# Patient Record
Sex: Female | Born: 1935 | Race: White | Hispanic: No
Health system: Southern US, Community
[De-identification: ages and names within clinical notes are randomized; demographics above are authoritative.]

---

## 2006-04-12 ENCOUNTER — Other Ambulatory Visit: Payer: Self-pay

## 2006-04-12 ENCOUNTER — Emergency Department: Payer: Self-pay | Admitting: Emergency Medicine

## 2012-11-09 ENCOUNTER — Emergency Department: Payer: Self-pay | Admitting: Emergency Medicine

## 2012-12-02 ENCOUNTER — Inpatient Hospital Stay: Payer: Self-pay | Admitting: Orthopedic Surgery

## 2012-12-02 LAB — URINALYSIS, COMPLETE
Blood: NEGATIVE
Ketone: NEGATIVE
Nitrite: NEGATIVE
Protein: NEGATIVE
RBC,UR: 7 /HPF (ref 0–5)
Specific Gravity: 1.021 (ref 1.003–1.030)
Squamous Epithelial: <1

## 2012-12-02 LAB — COMPREHENSIVE METABOLIC PANEL
Alkaline Phosphatase: 84 U/L (ref 50–136)
BUN: 29 mg/dL — ABNORMAL HIGH (ref 7–18)
Bilirubin,Total: 0.3 mg/dL (ref 0.2–1.0)
EGFR (African American): >60
Osmolality: 285 (ref 275–301)
Potassium: 4.1 mmol/L (ref 3.5–5.1)
Sodium: 140 mmol/L (ref 136–145)

## 2012-12-02 LAB — CBC
HCT: 35.1 % (ref 35.0–47.0)
HGB: 11.4 g/dL — ABNORMAL LOW (ref 12.0–16.0)
WBC: 7.9 10*3/uL (ref 3.6–11.0)

## 2012-12-03 LAB — CBC WITH DIFFERENTIAL/PLATELET
Eosinophil %: 3.6 %
MCH: 30.9 pg (ref 26.0–34.0)
MCHC: 33.7 g/dL (ref 32.0–36.0)
Neutrophil #: 4.8 10*3/uL (ref 1.4–6.5)
Neutrophil %: 71.3 %
RDW: 14.9 % — ABNORMAL HIGH (ref 11.5–14.5)
WBC: 6.7 10*3/uL (ref 3.6–11.0)

## 2012-12-03 LAB — BASIC METABOLIC PANEL
BUN: 24 mg/dL — ABNORMAL HIGH (ref 7–18)
Calcium, Total: 8.1 mg/dL — ABNORMAL LOW (ref 8.5–10.1)
Chloride: 107 mmol/L (ref 98–107)
Co2: 31 mmol/L (ref 21–32)
Creatinine: 0.9 mg/dL (ref 0.60–1.30)
EGFR (African American): >60
EGFR (Non-African Amer.): 53 — ABNORMAL LOW
Glucose: 100 mg/dL — ABNORMAL HIGH (ref 65–99)
Potassium: 3.8 mmol/L (ref 3.5–5.1)
Sodium: 141 mmol/L (ref 136–145)

## 2012-12-04 LAB — CBC WITH DIFFERENTIAL/PLATELET
Basophil #: 0.1 10*3/uL (ref 0.0–0.1)
Eosinophil #: 0 10*3/uL (ref 0.0–0.7)
Eosinophil %: 0 %
HCT: 24.8 % — ABNORMAL LOW (ref 35.0–47.0)
HGB: 8.3 g/dL — ABNORMAL LOW (ref 12.0–16.0)
MCHC: 33.6 g/dL (ref 32.0–36.0)
MCV: 92 fL (ref 80–100)
Monocyte #: 0.6 x10 3/mm (ref 0.2–0.9)
Neutrophil #: 8.8 10*3/uL — ABNORMAL HIGH (ref 1.4–6.5)
Platelet: 275 10*3/uL (ref 150–440)
WBC: 10.2 10*3/uL (ref 3.6–11.0)

## 2012-12-04 LAB — BASIC METABOLIC PANEL
Anion Gap: 5 — ABNORMAL LOW (ref 7–16)
BUN: 25 mg/dL — ABNORMAL HIGH (ref 7–18)
Calcium, Total: 7.9 mg/dL — ABNORMAL LOW (ref 8.5–10.1)
Chloride: 103 mmol/L (ref 98–107)
Co2: 31 mmol/L (ref 21–32)
EGFR (African American): 48 — ABNORMAL LOW
EGFR (Non-African Amer.): 42 — ABNORMAL LOW
Glucose: 136 mg/dL — ABNORMAL HIGH (ref 65–99)
Potassium: 4.1 mmol/L (ref 3.5–5.1)
Sodium: 139 mmol/L (ref 136–145)

## 2012-12-05 LAB — CBC WITH DIFFERENTIAL/PLATELET
Basophil #: 0 10*3/uL (ref 0.0–0.1)
Basophil %: 0.3 %
Eosinophil #: 0 10*3/uL (ref 0.0–0.7)
HCT: 19.9 % — ABNORMAL LOW (ref 35.0–47.0)
MCHC: 34.5 g/dL (ref 32.0–36.0)
Monocyte #: 0.7 x10 3/mm (ref 0.2–0.9)
Monocyte %: 8.1 %
Neutrophil #: 7.7 10*3/uL — ABNORMAL HIGH (ref 1.4–6.5)
Neutrophil %: 83.3 %
RBC: 2.19 10*6/uL — ABNORMAL LOW (ref 3.80–5.20)
RDW: 14.6 % — ABNORMAL HIGH (ref 11.5–14.5)
WBC: 9.2 10*3/uL (ref 3.6–11.0)

## 2012-12-05 LAB — BASIC METABOLIC PANEL
Chloride: 106 mmol/L (ref 98–107)
EGFR (African American): 41 — ABNORMAL LOW
EGFR (Non-African Amer.): 35 — ABNORMAL LOW
Glucose: 142 mg/dL — ABNORMAL HIGH (ref 65–99)
Potassium: 3.6 mmol/L (ref 3.5–5.1)
Sodium: 139 mmol/L (ref 136–145)

## 2012-12-06 LAB — PATHOLOGY REPORT

## 2012-12-16 LAB — COMPREHENSIVE METABOLIC PANEL
Alkaline Phosphatase: 126 U/L (ref 50–136)
Anion Gap: 7 (ref 7–16)
BUN: 29 mg/dL — ABNORMAL HIGH (ref 7–18)
Bilirubin,Total: 0.7 mg/dL (ref 0.2–1.0)
Calcium, Total: 8.7 mg/dL (ref 8.5–10.1)
Chloride: 100 mmol/L (ref 98–107)
Co2: 26 mmol/L (ref 21–32)
Creatinine: 1.24 mg/dL (ref 0.60–1.30)
EGFR (Non-African Amer.): 36 — ABNORMAL LOW
Glucose: 133 mg/dL — ABNORMAL HIGH (ref 65–99)
Osmolality: 274 (ref 275–301)
Potassium: 3.8 mmol/L (ref 3.5–5.1)
SGOT(AST): 34 U/L (ref 15–37)
SGPT (ALT): 38 U/L (ref 12–78)

## 2012-12-16 LAB — CBC
HCT: 30.1 % — ABNORMAL LOW (ref 35.0–47.0)
HGB: 9.2 g/dL — ABNORMAL LOW (ref 12.0–16.0)
MCH: 28.1 pg (ref 26.0–34.0)
MCV: 92 fL (ref 80–100)
RBC: 3.26 10*6/uL — ABNORMAL LOW (ref 3.80–5.20)
RDW: 16.2 % — ABNORMAL HIGH (ref 11.5–14.5)

## 2012-12-16 LAB — APTT: Activated PTT: 32.9 secs (ref 23.6–35.9)

## 2012-12-17 LAB — CBC WITH DIFFERENTIAL/PLATELET
Basophil #: 0 10*3/uL (ref 0.0–0.1)
Eosinophil #: 0.4 10*3/uL (ref 0.0–0.7)
Eosinophil %: 4 %
HCT: 24.9 % — ABNORMAL LOW (ref 35.0–47.0)
Lymphocyte #: 0.4 10*3/uL — ABNORMAL LOW (ref 1.0–3.6)
MCH: 31.5 pg (ref 26.0–34.0)
Monocyte #: 0.6 x10 3/mm (ref 0.2–0.9)
Monocyte %: 5.6 %
RBC: 2.7 10*6/uL — ABNORMAL LOW (ref 3.80–5.20)
RDW: 15.6 % — ABNORMAL HIGH (ref 11.5–14.5)

## 2012-12-17 LAB — BASIC METABOLIC PANEL
Anion Gap: 5 — ABNORMAL LOW (ref 7–16)
BUN: 29 mg/dL — ABNORMAL HIGH (ref 7–18)
Calcium, Total: 8.5 mg/dL (ref 8.5–10.1)
Chloride: 99 mmol/L (ref 98–107)
Co2: 30 mmol/L (ref 21–32)
Creatinine: 1.11 mg/dL (ref 0.60–1.30)
EGFR (African American): 48 — ABNORMAL LOW
Glucose: 108 mg/dL — ABNORMAL HIGH (ref 65–99)
Potassium: 3.5 mmol/L (ref 3.5–5.1)
Sodium: 134 mmol/L — ABNORMAL LOW (ref 136–145)

## 2012-12-18 ENCOUNTER — Inpatient Hospital Stay: Payer: Self-pay | Admitting: Internal Medicine

## 2012-12-18 LAB — CBC WITH DIFFERENTIAL/PLATELET
Basophil %: 0.4 %
Eosinophil #: 0.3 10*3/uL (ref 0.0–0.7)
Eosinophil %: 3.7 %
HCT: 23.3 % — ABNORMAL LOW (ref 35.0–47.0)
HGB: 7.9 g/dL — ABNORMAL LOW (ref 12.0–16.0)
Lymphocyte #: 0.5 10*3/uL — ABNORMAL LOW (ref 1.0–3.6)
MCHC: 34 g/dL (ref 32.0–36.0)
MCV: 92 fL (ref 80–100)
Monocyte #: 0.5 x10 3/mm (ref 0.2–0.9)
Neutrophil %: 82.5 %
Platelet: 222 10*3/uL (ref 150–440)
RBC: 2.54 10*6/uL — ABNORMAL LOW (ref 3.80–5.20)
RDW: 15.6 % — ABNORMAL HIGH (ref 11.5–14.5)
WBC: 7.3 10*3/uL (ref 3.6–11.0)

## 2012-12-19 LAB — CBC WITH DIFFERENTIAL/PLATELET
Basophil %: 0.7 %
Eosinophil %: 7 %
HCT: 28.9 % — ABNORMAL LOW (ref 35.0–47.0)
HGB: 9.9 g/dL — ABNORMAL LOW (ref 12.0–16.0)
Lymphocyte #: 0.7 10*3/uL — ABNORMAL LOW (ref 1.0–3.6)
Lymphocyte %: 12.5 %
MCH: 30.8 pg (ref 26.0–34.0)
MCV: 90 fL (ref 80–100)
Monocyte %: 10 %
Neutrophil %: 69.8 %
RBC: 3.22 10*6/uL — ABNORMAL LOW (ref 3.80–5.20)
RDW: 15.3 % — ABNORMAL HIGH (ref 11.5–14.5)
WBC: 5.8 10*3/uL (ref 3.6–11.0)

## 2012-12-20 ENCOUNTER — Ambulatory Visit: Payer: Self-pay | Admitting: Internal Medicine

## 2012-12-20 LAB — CBC WITH DIFFERENTIAL/PLATELET
Basophil #: 0.1 10*3/uL (ref 0.0–0.1)
Basophil %: 0.9 %
HCT: 28.7 % — ABNORMAL LOW (ref 35.0–47.0)
Lymphocyte #: 0.9 10*3/uL — ABNORMAL LOW (ref 1.0–3.6)
MCHC: 34.6 g/dL (ref 32.0–36.0)
MCV: 90 fL (ref 80–100)
Monocyte %: 9.4 %
Neutrophil %: 68.2 %
Platelet: 253 10*3/uL (ref 150–440)
RBC: 3.19 10*6/uL — ABNORMAL LOW (ref 3.80–5.20)

## 2012-12-20 LAB — BASIC METABOLIC PANEL
Creatinine: 0.86 mg/dL (ref 0.60–1.30)
EGFR (African American): >60
EGFR (Non-African Amer.): 56 — ABNORMAL LOW
Glucose: 97 mg/dL (ref 65–99)
Osmolality: 276 (ref 275–301)
Sodium: 136 mmol/L (ref 136–145)

## 2012-12-20 LAB — PROTIME-INR
INR: 1.1
Prothrombin Time: 14.2 secs (ref 11.5–14.7)

## 2012-12-22 LAB — PROTIME-INR: Prothrombin Time: 15.3 secs — ABNORMAL HIGH (ref 11.5–14.7)

## 2012-12-22 LAB — CBC WITH DIFFERENTIAL/PLATELET
Basophil #: 0.1 10*3/uL (ref 0.0–0.1)
Basophil %: 0.8 %
Eosinophil #: 0.3 10*3/uL (ref 0.0–0.7)
Eosinophil %: 4 %
HGB: 9.5 g/dL — ABNORMAL LOW (ref 12.0–16.0)
Lymphocyte #: 1.2 10*3/uL (ref 1.0–3.6)
MCH: 30.8 pg (ref 26.0–34.0)
MCHC: 34.8 g/dL (ref 32.0–36.0)
MCV: 89 fL (ref 80–100)
Monocyte #: 0.7 x10 3/mm (ref 0.2–0.9)
Monocyte %: 8.9 %
Neutrophil #: 5.8 10*3/uL (ref 1.4–6.5)
RDW: 15.6 % — ABNORMAL HIGH (ref 11.5–14.5)
WBC: 8.1 10*3/uL (ref 3.6–11.0)

## 2012-12-22 LAB — BASIC METABOLIC PANEL
BUN: 21 mg/dL — ABNORMAL HIGH (ref 7–18)
Calcium, Total: 8.1 mg/dL — ABNORMAL LOW (ref 8.5–10.1)
Chloride: 104 mmol/L (ref 98–107)
Co2: 30 mmol/L (ref 21–32)
Creatinine: 0.65 mg/dL (ref 0.60–1.30)
EGFR (Non-African Amer.): >60
Glucose: 93 mg/dL (ref 65–99)
Osmolality: 280 (ref 275–301)
Potassium: 4.2 mmol/L (ref 3.5–5.1)

## 2012-12-23 LAB — CBC WITH DIFFERENTIAL/PLATELET
Basophil %: 1.1 %
Eosinophil %: 5.5 %
HCT: 26.3 % — ABNORMAL LOW (ref 35.0–47.0)
Lymphocyte #: 1.1 10*3/uL (ref 1.0–3.6)
Lymphocyte %: 16.5 %
MCH: 30.7 pg (ref 26.0–34.0)
MCHC: 33.9 g/dL (ref 32.0–36.0)
MCV: 91 fL (ref 80–100)
Monocyte #: 0.5 x10 3/mm (ref 0.2–0.9)
Monocyte %: 8.2 %
Neutrophil #: 4.5 10*3/uL (ref 1.4–6.5)
Neutrophil %: 68.7 %
RBC: 2.89 10*6/uL — ABNORMAL LOW (ref 3.80–5.20)
WBC: 6.5 10*3/uL (ref 3.6–11.0)

## 2012-12-23 LAB — BASIC METABOLIC PANEL
Anion Gap: 2 — ABNORMAL LOW (ref 7–16)
EGFR (African American): >60
Osmolality: 275 (ref 275–301)
Potassium: 3.9 mmol/L (ref 3.5–5.1)

## 2012-12-24 LAB — URINALYSIS, COMPLETE
Bilirubin,UR: NEGATIVE
Glucose,UR: NEGATIVE mg/dL (ref 0–75)
Hyaline Cast: 5
Leukocyte Esterase: NEGATIVE
Nitrite: NEGATIVE
Ph: 7 (ref 4.5–8.0)
RBC,UR: 6 /HPF (ref 0–5)
Specific Gravity: 1.021 (ref 1.003–1.030)
Squamous Epithelial: 2
WBC UR: 6 /HPF (ref 0–5)

## 2012-12-24 LAB — WOUND AEROBIC CULTURE

## 2012-12-25 LAB — BASIC METABOLIC PANEL
Anion Gap: 5 — ABNORMAL LOW (ref 7–16)
BUN: 20 mg/dL — ABNORMAL HIGH (ref 7–18)
Calcium, Total: 8.5 mg/dL (ref 8.5–10.1)
Chloride: 96 mmol/L — ABNORMAL LOW (ref 98–107)
Co2: 36 mmol/L — ABNORMAL HIGH (ref 21–32)
Creatinine: 0.97 mg/dL (ref 0.60–1.30)
EGFR (African American): 56 — ABNORMAL LOW
Glucose: 105 mg/dL — ABNORMAL HIGH (ref 65–99)
Potassium: 3.5 mmol/L (ref 3.5–5.1)
Sodium: 137 mmol/L (ref 136–145)

## 2012-12-26 LAB — BASIC METABOLIC PANEL
Anion Gap: 5 — ABNORMAL LOW (ref 7–16)
BUN: 18 mg/dL (ref 7–18)
Calcium, Total: 7.9 mg/dL — ABNORMAL LOW (ref 8.5–10.1)
Co2: 34 mmol/L — ABNORMAL HIGH (ref 21–32)
Glucose: 83 mg/dL (ref 65–99)
Osmolality: 277 (ref 275–301)
Sodium: 138 mmol/L (ref 136–145)

## 2012-12-26 LAB — WOUND CULTURE

## 2012-12-26 LAB — HEPATIC FUNCTION PANEL A (ARMC)
Alkaline Phosphatase: 91 U/L (ref 50–136)
Bilirubin, Direct: 0.1 mg/dL (ref 0.00–0.20)
SGOT(AST): 47 U/L — ABNORMAL HIGH (ref 15–37)
Total Protein: 5.6 g/dL — ABNORMAL LOW (ref 6.4–8.2)

## 2012-12-26 LAB — CBC WITH DIFFERENTIAL/PLATELET
Basophil %: 1.3 %
Eosinophil #: 0.4 10*3/uL (ref 0.0–0.7)
HCT: 27.7 % — ABNORMAL LOW (ref 35.0–47.0)
HGB: 9.4 g/dL — ABNORMAL LOW (ref 12.0–16.0)
Lymphocyte #: 1.1 10*3/uL (ref 1.0–3.6)
MCH: 30.6 pg (ref 26.0–34.0)
Monocyte #: 0.5 x10 3/mm (ref 0.2–0.9)
Monocyte %: 7.6 %
Neutrophil #: 5 10*3/uL (ref 1.4–6.5)
Platelet: 378 10*3/uL (ref 150–440)
RDW: 15.8 % — ABNORMAL HIGH (ref 11.5–14.5)
WBC: 7.1 10*3/uL (ref 3.6–11.0)

## 2012-12-27 LAB — VANCOMYCIN, TROUGH: Vancomycin, Trough: 9 ug/mL — ABNORMAL LOW (ref 10–20)

## 2012-12-27 LAB — BASIC METABOLIC PANEL
Anion Gap: 6 — ABNORMAL LOW (ref 7–16)
BUN: 12 mg/dL (ref 7–18)
Chloride: 101 mmol/L (ref 98–107)
EGFR (African American): >60
EGFR (Non-African Amer.): 58 — ABNORMAL LOW
Glucose: 106 mg/dL — ABNORMAL HIGH (ref 65–99)
Potassium: 3 mmol/L — ABNORMAL LOW (ref 3.5–5.1)

## 2012-12-28 LAB — BASIC METABOLIC PANEL
Anion Gap: 4 — ABNORMAL LOW (ref 7–16)
BUN: 9 mg/dL (ref 7–18)
Calcium, Total: 7.7 mg/dL — ABNORMAL LOW (ref 8.5–10.1)
Chloride: 104 mmol/L (ref 98–107)
Co2: 31 mmol/L (ref 21–32)
Creatinine: 0.71 mg/dL (ref 0.60–1.30)
EGFR (African American): >60
EGFR (Non-African Amer.): >60
Glucose: 116 mg/dL — ABNORMAL HIGH (ref 65–99)
Osmolality: 277 (ref 275–301)

## 2012-12-28 LAB — MAGNESIUM: Magnesium: 1.7 mg/dL — ABNORMAL LOW

## 2012-12-29 LAB — COMPREHENSIVE METABOLIC PANEL
Alkaline Phosphatase: 111 U/L (ref 50–136)
Calcium, Total: 8 mg/dL — ABNORMAL LOW (ref 8.5–10.1)
Chloride: 103 mmol/L (ref 98–107)
EGFR (African American): 52 — ABNORMAL LOW
EGFR (Non-African Amer.): 45 — ABNORMAL LOW
Glucose: 125 mg/dL — ABNORMAL HIGH (ref 65–99)
Osmolality: 277 (ref 275–301)
SGOT(AST): 32 U/L (ref 15–37)
SGPT (ALT): 29 U/L (ref 12–78)
Sodium: 138 mmol/L (ref 136–145)
Total Protein: 6.2 g/dL — ABNORMAL LOW (ref 6.4–8.2)

## 2012-12-30 LAB — URINALYSIS, COMPLETE
Glucose,UR: NEGATIVE mg/dL (ref 0–75)
Hyaline Cast: 9
Ketone: NEGATIVE
Protein: 30
Specific Gravity: 1.024 (ref 1.003–1.030)

## 2012-12-31 LAB — CREATININE, SERUM: EGFR (African American): >60

## 2012-12-31 LAB — PLATELET COUNT: Platelet: 180 10*3/uL (ref 150–440)

## 2013-01-20 ENCOUNTER — Ambulatory Visit: Payer: Self-pay | Admitting: Internal Medicine

## 2013-01-20 DEATH — deceased

## 2014-09-11 NOTE — Consult Note (Signed)
CC: abd pain,  minimal output from colostomy, palpable fullness and tenderness.   Old scars noted.  Possible ileus and possible SBO, previous colostomy from colon cancer surgery.  Discussed with Dr. Loreli SlotViakute.  Pt answers questions appropriately.  Per Dr. Loreli SlotViakute the patient does not want any surgery.  Her NG tube has been draining signif amts daily.  No further imput I can give.    Electronic Signatures: Scot JunElliott, Robert T (MD)  (Signed on 09-Aug-14 11:36)  Authored  Last Updated: 09-Aug-14 11:36 by Scot JunElliott, Robert T (MD)

## 2014-09-11 NOTE — Consult Note (Signed)
Patient's hgb is 8.6.  No further transfusion at this time.  Will recheck labs tomorrow.  Electronic Signatures: Juanell FairlyKrasinski, Melynda Krzywicki (MD)  (Signed on 17-Jul-14 17:32)  Authored  Last Updated: 17-Jul-14 17:32 by Juanell FairlyKrasinski, Yolunda Kloos (MD)

## 2014-09-11 NOTE — Consult Note (Signed)
PATIENT NAME:  ZANAYA, BAIZE MR#:  [id number] DATE OF BIRTH:  Mar 04, 1913  DATE OF CONSULTATION:  12/21/2012  REFERRING PHYSICIAN:  Dr. Martha Clan, orthopedics CONSULTING PHYSICIAN:  Starleen Arms, MD  PRIMARY CARE PHYSICIAN:  Dr. Lester Mayhill in [place].    REASON FOR CONSULTATION:  Uncontrolled high blood pressure.   HISTORY OF PRESENT ILLNESS:  This is a 81 year old female with significant past medical history of dementia, hypertension, coronary artery disease, gastroesophageal reflux disease, hypothyroidism, colorectal cancer with recurrence, the patient had recent hospitalization for mechanical fall where she went right hemiarthroplasty 12/03/2012, the patient was discharged to nursing home.  She was readmitted on 12/16/2012 with bleeding from her surgical incision, where CT of the abdomen did show a hematoma versus abscess, the patient's Lovenox has been stopped, with minimal oozing from site, the patient had a significant anemia upon presentation where she required 2 units of packed red blood cells transfusion, since then her hemoglobin has been stable, the patient was seen and evaluated by palliative care as per family request, hospitalist service were requested to evaluate for patient's uncontrolled blood pressure, the patient is on metoprolol tartrate 25 mg by mouth daily, the patient is an extremely poor historian.  Cannot give any reliable history secondary to her dementia, as well, she is very hard of hearing and cannot give any reliable history.   PAST MEDICAL HISTORY: 1.  Dementia.  2.  Colorectal cancer with recurrence.  3.  Coronary artery disease.  4.  Gastroesophageal reflux disease.  5.  Hypothyroidism.   PAST SURGICAL HISTORY: 1.  Colostomy.  2.  Cholecystectomy.  3.  Hemorrhoid surgery.  4.  Recent right hip hemiarthroplasty.   SOCIAL HISTORY:  The patient lives at assisted living facility.  No alcohol, no smoking.  No drug use.   FAMILY HISTORY:  Father died  of old age.  Mother died of vaginal cancer.   HOME MEDICATIONS: 1.  Norco 5/325 every four hours as needed for pain.  2.  Dulcolax suppository as needed.  3.  Calcium carbonate with vitamin D 1 tablet twice daily.  4.  Docusate calcium capsules at bedtime.  5.  Ferrous sulfate 325 mg by mouth twice daily.  6.  Levothyroxine 0.15 mg oral daily.  7.  Lorazepam 1 mg oral 3 times a day as needed for anxiety.  8.  Milk of magnesia as needed for constipation.  9.  Metoprolol tartrate 25 mg by mouth daily.  10.  Phenergan as needed.  11.  Cefazolin 1 gram every eight hours.  12.  Tylenol 325 mg oral every six hours.  13.  Aspirin 325 mg oral daily.   REVIEW OF SYSTEMS: The patient has advanced dementia, extremely hard of hearing, unable to obtain a reliable review of systems from her.   PHYSICAL EXAMINATION: VITAL SIGNS:  Temperature 97.8, pulse 78, respiratory rate 18, blood pressure 160/58, saturating 95% on 2 liters nasal cannula.  GENERAL:  Frail, elderly female who looks comfortable in bed in no apparent distress.  HEENT:  Head atraumatic, normocephalic.  Pupils equal, reactive to light.  Pink conjunctivae.  Anicteric sclerae.  Moist oral mucosa.  NECK:  Supple.  No thyromegaly.  No JVD.  CHEST:  Good air entry bilaterally.  No wheezing, rales, rhonchi.  CARDIOVASCULAR:  S1, S2 heard.  No rubs, murmurs, gallops.  ABDOMEN:  Soft, nontender, nondistended.  Bowel sounds present.  EXTREMITIES:  No edema.  Has right hip dressing intact, as well wearing bilateral pressure ulcer protective  boots, unable to do full visualization.  NEUROLOGIC:  Nonfocal.  Appears to be moving all extremities without significant deficits.  PSYCHIATRIC:  The patient is awake, alert to person only.  Poor cognition, confused.  Has heel ulcers unstageable.   PERTINENT LABORATORY DATA:  Glucose 97, BUN 23, creatinine 0.86, sodium 136, potassium 3.5, chloride 101, CO2 28, albumin 1.9.  White blood cells 6.1, hemoglobin  9.9, hematocrit 28.7, platelets 253.  INR 1.1.   ASSESSMENT AND PLAN: 1.  Uncontrolled blood pressure, the patient appears to be having uncontrolled blood pressure, mainly around nighttime, her metoprolol is metoprolol tartrate, only given one time during the day, so this is short-acting and most likely wears out by night, so we will change her metoprolol to twice daily, as well we will add Norvasc 5 mg oral daily and if still uncontrolled can increase the Norvasc to 10 mg oral daily.  2.  Protein calorie malnutrition, the patient has low albumin at 1.9, we will start the patient on Ensure 3 times a day to encourage her wound and pressure ulcers healing.  3.  Fluid collection at surgical site.  This is abscess versus hematoma, this is managed primarily by orthopedic team, but so far her hemoglobin has been stable after the blood transfusion and holding her Lovenox, and as well she is on cefazolin.  4.  Anemia, this is most likely anemia to acute blood loss, postsurgical as well from hematoma to stable status post bright red blood cells transfusion, she is on iron supplement.  5.  Hypothyroidism.  Continue with Synthroid.  6.  Dementia.  Continue with supportive care.  7.  DVT prophylaxis, currently her Lovenox is held due to her hematoma at the surgical site, she is on Ted hose.  8.  Hypothyroidism.  Continue with Levothyroxine.  9.  Gastroesophageal reflux disease.  She is on PPI.   Total time spent on consultation 55 minutes.    ____________________________ Starleen Armsawood S. Amalio Loe, MD dse:ea D: 12/21/2012 05:38:38 ET T: 12/21/2012 06:28:31 ET JOB#: [id number]  cc: Starleen Armsawood S. Vera Furniss, MD, <Dictator> Aleasha Fregeau Teena IraniS Keny Donald MD ELECTRONICALLY SIGNED 12/23/2012 1:59

## 2014-09-11 NOTE — Consult Note (Signed)
PATIENT NAME:  Brandi Porter, Brandi Porter MR#:  [id number] DATE OF BIRTH:  1912/11/03  DATE OF CONSULTATION:  12/25/2012  REFERRING PHYSICIAN:   CONSULTING PHYSICIAN:  Quentin Orealph L. Ely III, MD  PRIMARY CARE PHYSICIAN:  Not local.   ADMITTING PHYSICIAN: Dr. Martha ClanKrasinski.   CHIEF COMPLAINT: Nausea and vomiting. Possible small bowel obstruction.   BRIEF HISTORY: Ms. Brandi NovelWilma Moynahan is a 81 year old woman admitted with orthopedic complication following a hip surgery. She had a fall with a fractured hip, for which she underwent a right hemiarthroplasty in the middle of July. She was admitted to rehab for follow-up post surgery. She developed a hematoma and then an abscess in the surgical site and now has MRSA infection in the right hip incision. She has developed profound nausea and vomiting over the last 24 hours. She is found to have significant amount of fluid. CT scan was performed, which suggested possible postoperative bowel obstruction. The surgical service was consulted.   She has a history of colorectal cancer dating back almost 15 to 18 years with abdominal perineal resection and a permanent lower quadrant ostomy. She does have some sort of recurrence history, although I do not have the details. Her daughter does not know and the patient does not have any reliable information. She also has history of coronary artery disease, hypertension and significant dementia. She had a previous cholecystectomy, hemorrhoid surgery and colectomy as noted above. She lives in an assisted living facility. She does have a daughter who is closed and is her primary caregiver outside of the assisted living facility.   MEDICATIONS: Noted in her admission note.   PHYSICAL EXAMINATION: GENERAL: She is  alert and interactive, although not particularly appropriate. She is significantly hard of hearing.  HEENT: No scleral icterus, no pupillary abnormalities.  NECK: Supple, nontender with midline trachea. No adenopathy.  CHEST: Clear  with no adventitious sounds and normal pulmonary excursion.  CARDIAC: No murmurs or gallops. She seems to be in normal sinus rhythm.  ABDOMEN: Her abdomen is mildly distended, nontender with few bowel sounds. No rebound, no guarding. No hernias are noted.  LOWER EXTREMITIES: Reveals full range of motion. No deformities. She does have some  increased tenderness in the right hip.  PSYCHIATRIC: Reveals normal orientation, although she is markedly confused, but normal affect.   ASSESSMENT AND RECOMMENDATIONS: I have independently reviewed her CT scan. I looked at her laboratory values. CT scan does demonstrate a markedly enlarged common bile duct and significant intrahepatic ductal dilatation. Her last bilirubin from mid-July was normal. No evidence of any obstructing mass in the pancreas. She does have dilated loops of proximal small bowel and decompressed loops of distal small bowel. There was not appear to be an obvious transition point, but with the hip replacement, there is significant defect on the CT scan in the pelvis. Multiple clips in the pelvis from her previous surgery.   At 5498, I do not see any indication for urgent surgical intervention. This set of clinical circumstances strongly suggested an ileus, although a CT scan does have evidence of possible bowel obstruction. I would decompress her with nasogastric tube and continue IV hydration. We will follow electrolytes. We will repeat her films and make a decision regarding further intervention. I talked with her daughter at length about surgery about the possibility of surgery. She would be a very surgical candidate, but if this problem does not resolve, I would be very concerned with her the mental status, that she would have a miserable end to  her life from a  bowel obstruction. We will talk to the family about intervention if it becomes appropriate. I would like to work-up her bile duct a bit with repeating her laboratory values. The family is in  agreement with that decision.    ____________________________ Carmie End, MD rle:cc D: 12/25/2012 17:24:03 ET T: 12/25/2012 17:49:16 ET JOB#: [id number]  cc: Carmie End, MD, <Dictator> Kathreen Devoid, MD Quentin Ore MD ELECTRONICALLY SIGNED 12/27/2012 16:57

## 2014-09-11 NOTE — H&P (Signed)
Subjective/Chief Complaint Right femoral neck hip fracture   History of Present Illness Patient is a 81 year old female with dementia who lives at the [facility].  According to her daughter the patient has been complaining of knee pain and was noted to have edema in the right lower extremity.  Her daughter states the patient mentioned that she had a fall last week which was not witnessed.  The patient was reported to have been able to get back in her wheelchair but has not been able to ambulate since.  At baseline she ambulates with the assistance of a walker.  The daughter had requested transfer to [facility] for further evaluation today.  The history is obtained from the daughter due to the patient's dementia.  Patient is seen and examined in her hospital room and her daughter is at the bedside.  The patient is complaining of right hip pain on exam this evening which the daughter states she had not been complaining of prior to today.   The daughter is a family friend of Dr. Marry Guan and he had stopped by earlier to discuss the injury with the patient's daughter.   Past Med/Surgical Hx:  Anxiety:   MI - Myocardial Infarct:   CAD:   Hypothyroidism:   Colon or Rectal Cancer:   GERD - Esophageal Reflux:   Alzheimer's Disease:   Denies medical history:   Colostomy:   ALLERGIES:  No Known Allergies:   HOME MEDICATIONS: Medication Instructions Status  CeleXA 10 mg oral tablet 0.5 tab(s) orally once a day for 1 week then stop (9 am) Active  Metoprolol Tartrate 25 mg oral tablet 1 tab(s) orally once a day (in the morning) (8 am) Active  Myrbetriq 25 mg oral tablet, extended release 1 tab(s) orally once a day (in the morning) (8 am) **do not crush** Active  RABEprazole 20 mg oral delayed release tablet 1 tab(s) orally once a day (9 am) **do not crush** Active  levothyroxine 75 mcg (0.075 mg) oral tablet 1 tab(s) orally once a day (8 am) Active  LORazepam 1 mg oral tablet 1 tab(s) orally 3 times a  day as needed for anxiety (control) Active  aspirin 81 mg oral tablet, chewable 1 tab(s) orally once a day (9 am) Active   Family and Social History:  Family History Non-Contributory   Place of Living Nursing Home   Review of Systems:  Subjective/Chief Complaint Right hip pain   Physical Exam:  GEN no acute distress, thin   HEENT PERRL, dry oral mucosa, Oropharynx clear, Patient is hard of hearing   NECK supple  No masses  trachea midline   RESP normal resp effort  clear BS  no use of accessory muscles   CARD regular rate  no murmur  No LE edema  no JVD   ABD denies tenderness  soft  normal BS  no Adominal Mass   GU foley catheter in place  clear yellow urine draining   LYMPH negative neck   EXTR Right lower extremity is in Buck's traction.  She has palpable pedal pulses and intact sensation to light touch.  The patient can flex and extend her toes.  She has slight shortening and external rotation to the right lower extremity.   SKIN normal to palpation, No ulcers   NEURO motor/sensory function intact   PSYCH alert   Lab Results: Hepatic:  14-Jul-14 20:07   Bilirubin, Total 0.3  Alkaline Phosphatase 84  SGPT (ALT) 44  SGOT (AST)  27  Total Protein, Serum  6.1  Albumin, Serum  2.6  Routine BB:  14-Jul-14 20:07   ABO Group + Rh Type O Positive  Antibody Screen NEGATIVE (Result(s) reported on 02 Dec 2012 at 09:59PM.)  Crossmatch Unit 1 Ready  Crossmatch Unit 2 Ready (Result(s) reported on 02 Dec 2012 at 10:04PM.)  Routine Chem:  14-Jul-14 20:07   Glucose, Serum 97  BUN  29  Creatinine (comp) 0.86  Sodium, Serum 140  Potassium, Serum 4.1  Chloride, Serum 105  CO2, Serum  33  Calcium (Total), Serum 8.5  Osmolality (calc) 285  eGFR (African American) >60  eGFR (Non-African American)  56 (eGFR values <47m/min/1.73 m2 may be an indication of chronic kidney disease (CKD). Calculated eGFR is useful in patients with stable renal function. The eGFR calculation  will not be reliable in acutely ill patients when serum creatinine is changing rapidly. It is not useful in  patients on dialysis. The eGFR calculation may not be applicable to patients at the low and high extremes of body sizes, pregnant women, and vegetarians.)  Anion Gap  2  Routine UA:  14-Jul-14 22:04   Color (UA) Yellow  Clarity (UA) Clear  Glucose (UA) Negative  Bilirubin (UA) Negative  Ketones (UA) Negative  Specific Gravity (UA) 1.021  Blood (UA) Negative  pH (UA) 5.0  Protein (UA) Negative  Nitrite (UA) Negative  Leukocyte Esterase (UA) Negative (Result(s) reported on 02 Dec 2012 at 10:14PM.)  RBC (UA) 7 /HPF  WBC (UA) 1 /HPF  Bacteria (UA) TRACE  Epithelial Cells (UA) <1 /HPF  Mucous (UA) PRESENT (Result(s) reported on 02 Dec 2012 at 10:14PM.)  Routine Coag:  14-Jul-14 20:07   Prothrombin 14.1  INR 1.1 (INR reference interval applies to patients on anticoagulant therapy. A single INR therapeutic range for coumarins is not optimal for all indications; however, the suggested range for most indications is 2.0 - 3.0. Exceptions to the INR Reference Range may include: Prosthetic heart valves, acute myocardial infarction, prevention of myocardial infarction, and combinations of aspirin and anticoagulant. The need for a higher or lower target INR must be assessed individually. Reference: The Pharmacology and Management of the Vitamin K  antagonists: the seventh ACCP Conference on Antithrombotic and Thrombolytic Therapy. COEHOZ.2248Sept:126 (3suppl): [id number] A HCT value >55% may artifactually increase the PT.  In one study,  the increase was an average of 25%. Reference:  "Effect on Routine and Special Coagulation Testing Values of Citrate Anticoagulant Adjustment in Patients with High HCT Values." American Journal of Clinical Pathology 2006;126:400-405.)  Activated PTT (APTT) 30.1 (A HCT value >55% may artifactually increase the APTT. In one study, the increase was  an average of 19%. Reference: "Effect on Routine and Special Coagulation Testing Values of Citrate Anticoagulant Adjustment in Patients with High HCT Values." American Journal of Clinical Pathology 2006;126:400-405.)  Routine Hem:  14-Jul-14 20:07   WBC (CBC) 7.9  RBC (CBC)  3.77  Hemoglobin (CBC)  11.4  Hematocrit (CBC) 35.1  Platelet Count (CBC) 260 (Result(s) reported on 02 Dec 2012 at 08:48PM.)  MCV 93  MCH 30.4  MCHC 32.6  RDW  14.9   Radiology Results: XRay:    14-Jul-14 18:03, Chest 1 View AP or PA  Chest 1 View AP or PA  REASON FOR EXAM:    fall  COMMENTS:       PROCEDURE: DXR - DXR CHEST 1 VIEWAP OR PA  - Dec 02 2012  6:03PM     RESULT: The lungs  are mildly hyperinflated. There is no focal infiltrate.   The cardiac silhouette is top normal in size. The pulmonary vascularity   is not engorged. The left hemidiaphragm is partially obscured. The   observed portions of the bony thorax are grossly intact. There are   degenerative changes of both shoulders.    IMPRESSION:  There is no evidence of acute thoracic injury.There is   density in the left lower hemithorax which is nonspecific and may reflect   atelectasis or pneumonia. A followup PA and lateral chest x-ray or chest   CT scan would be useful if there are strong clinical concerns of occult     thoracic injury.     Dictation Site: 5        Verified By: DAVID A. Martinique, M.D., MD    14-Jul-14 18:03, Hip Right Complete  Hip Right Complete  REASON FOR EXAM:    right leg pain  COMMENTS:       PROCEDURE: DXR - DXR HIP RIGHT COMPLETE  - Dec 02 2012  6:03PM     RESULT: AP and lateral views of the right hip reveal an acute subcapital   fracture. Mild superior migration of the femur with respect to the   femoral head is present. The intertrochanteric region appears intact. The   observed portions of the right hemipelvis also appear normal.    IMPRESSION:  The patient is sustained an acute subcapital fracture of  the   right hip.     Dictation Site: 5    Verified By: DAVID A. Martinique, M.D., MD    14-Jul-14 18:03, Knee Right AP and Lateral  Knee Right AP and Lateral  REASON FOR EXAM:    fall injury  COMMENTS:       PROCEDURE: DXR - DXR KNEE RIGHT AP AND LATERAL  - Dec 02 2012  6:03PM     RESULT: AP and lateral views of the right knee reveal the bones to be   diffusely osteopenic. There is mild narrowing of the medial joint   compartment. There is beaking of the tibial spines. There are small spurs   from the superior and inferior margins of the patella. There is no   evidence of an acute fracture nor dislocation nor joint effusion.    IMPRESSION:  There are degenerative changes of the right knee, but I do   not see evidence of an acute fracture. There is lucency in the visualized   portions of the proximal third of the shaft of the right femur which   likely are normal for the patient, but I cannot exclude a partially     imaged fracture here if there are are clinical symptoms referable to the   tibia. A tibial series would be useful.     Dictation Site: 5        Verified By: DAVID A. Martinique, M.D., MD    14-Jul-14 18:03, Pelvis AP Only  Pelvis AP Only  REASON FOR EXAM:    fall injury  COMMENTS:       PROCEDURE: DXR - DXR PELVIS AP ONLY  - Dec 02 2012  6:03PM     RESULT: The bony pelvis is osteopenic. There is an acute subcapital   fracture of the right hip. The intertrochanteric and subtrochanteric  regions of the right femur appear normal. There are numerous surgical   clips over the pelvis. Bowel gas pattern is nonspecific.    IMPRESSION:  The patient has sustained an acute subcapital  fracture of   the right hip.     Dictation Site: 5    Verified By: DAVID A. Martinique, M.D., MD  LabUnknown:    14-Jul-14 18:03, Chest 1 View AP or PA  PACS Image    14-Jul-14 18:03, Hip Right Complete  PACS Image    14-Jul-14 18:03, Knee Right AP and Lateral  PACS Image    14-Jul-14 18:03,  Pelvis AP Only  PACS Image    Assessment/Admission Diagnosis Displaced right femoral neck hip fracture of indeterminate age   Plan I discussed the injury with the patient's daughter.  I have recommended a right hip hemiarthroplasty as treatment for the fracture.  The daughter has undergone a hemiarthroplasty previously herself and is aware of what this surgery entails.  She understands that her mother is at high surgical risk for this procedure due to her advanced age and history of CAD.  She wished to proceed with surgery.  The risks and benefits of surgical intervention were discussed in detail with the patient's daughter and she expressed understanding of the risks and benefits and agreed with plans for surgery.  The risks include, but are not limited to: infection, bleeding requiring transfusion, nerve and blood vessel injury (especially the sciatic nerve leading to foot drop), dislocation, fracture, leg length discrepancy, change in lower extremity rotation, failure to return to independent ambulation, persistent right hip pain,  need for more surgery including conversion to a total hip arthroplasty, DVT, and PE, MI, stroke, pneumonia, respiratory failure and death.  Surgical site signed as per "right site surgery" protocol.  Patient will be NPO after midnight in preparation for surgery.  She has been seen by the hospitalist, Dr. Leslye Peer who feels that she is at high risk for surgery, but that she is as medically optimized as she can be prior to surgery.  I have answered all questions the patient's daughter asked.  The surgery is schedule for tomorrow afternoon.   Electronic Signatures: Thornton Park (MD)  (Signed 14-Jul-14 23:21)  Authored: CHIEF COMPLAINT and HISTORY, PAST MEDICAL/SURGIAL HISTORY, ALLERGIES, HOME MEDICATIONS, FAMILY AND SOCIAL HISTORY, REVIEW OF SYSTEMS, PHYSICAL EXAM, LABS, Radiology, ASSESSMENT AND PLAN   Last Updated: 14-Jul-14 23:21 by Thornton Park (MD)

## 2014-09-11 NOTE — Consult Note (Signed)
PATIENT NAME:  Brandi Porter, Brandi Porter MR#:  [id number] DATE OF BIRTH:  04-07-1913  DATE OF CONSULTATION:  12/17/2012  REFERRING PHYSICIAN:   CONSULTING PHYSICIAN:  Linus Galasodd Quaneisha Hanisch, DPM  REASON FOR CONSULTATION: This is a 81 year old female who recently underwent right hip hemiarthroplasty on 12/03/2012 who was readmitted through to the ER for evaluation of bleeding from her surgical incision. It was noted that the patient had bilateral heel blisters and consult was made for evaluation.   PAST MEDICAL HISTORY: 1.  Heart disease with previous MI.  2.  Hypothyroidism. 3.  Colon cancer. 4.  GERD. 5.  Alzheimer disease.   PAST SURGICAL HISTORY: Right hip hemiarthroplasty.   HOME MEDICATIONS: Reviewed in the chart.   ALLERGIES: No known drug allergies.   FAMILY HISTORY: Unobtainable.   SOCIAL HISTORY: Unobtainable.  REVIEW OF SYSTEMS:  Unobtainable.   PHYSICAL EXAMINATION: VASCULAR: DP and PT pulses are palpable in the feet. Capillary filling time is intact.  NEUROLOGICAL: The patient does appear to have pain response in both lower extremities and feet. Hard to assess light touch sensation.  INTEGUMENT: Skin is warm, dry and atrophic. Two intact blisters are noted on the plantar posterior aspect of each heel, approximately 3 to 4 cm diameter. These are fluid filled with no significant signs of cellulitis or infection.  MUSCULOSKELETAL: I did not performed. The patient is in a brace to prevent movement in her hips.   ASSESSMENT: Bilateral heel ulcerations.  At this point do not appear to be infected.   PLAN: Would continue in the offloading boots to keep pressure off of the areas. As long as they remain closed in, we just recommend pressure relief. If they open up, we can be reconsulted for debridement of the tissue and begin local wound care. Hopefully these should start to dry up and then can be debrided at a later date.  ____________________________ Linus Galasodd Taos Tapp, DPM tc:sb D: 12/17/2012 12:46:17  ET T: 12/17/2012 12:51:43 ET JOB#: [id number]  cc: Linus Galasodd Skyleigh Windle, DPM, <Dictator> Leonia Heatherly DPM ELECTRONICALLY SIGNED 12/19/2012 10:26

## 2014-09-11 NOTE — Discharge Summary (Signed)
PATIENT NAME:  Brandi Porter, Brandi Porter MR#:  [id number] DATE OF BIRTH:  July 24, 1912  DATE OF ADMISSION:  12/16/2012 DATE OF DISCHARGE:  12/31/2012  ADMITTING DIAGNOSIS: Drainage from right hip incision status post hemiarthroplasty on 12/03/2012.   DISCHARGE DIAGNOSIS:  1.  Irrigation and debridement of right hip wound, infected hematoma with methicillin-resistant Staphylococcus aureus. 2.  Malignant hypertension.  3.  Anemia status post packed red blood cell transfusion.  4.  Dementia.  5.  Hypothyroidism. 6.  Gastroesophageal reflux disease.  7.  Partial small bowel obstruction status post NG tube placement and continuing NG suctioning for the past 6 days with no significant improvement.  8.  History of hemiarthroplasty on 12/03/2012, as mentioned above, on the right side.  9.  Fascial heel blisters due to immobilization. 10.  Malnutrition.  11.  Colorectal cancer with recurrence status post enterostomy placement in left side of abdomen.  12.  History of coronary artery disease. 13.  History of gastroesophageal reflux disease.  14.  History of cholecystectomy, hemorrhoid surgery.  DISCHARGE CONDITION: Poor.  DISCHARGE MEDICATIONS: 1.  Acetaminophen 325 mg 2 tablets every 4 hours as needed.  2.  Lorazepam 1 mg 3 times daily as needed. 3.  Lactulose 30 mL twice daily as needed. 4.  Levothyroxine 150 mcg p.o. daily. 5.  Protonix 40 mg p.o. daily. 6.  Zofran 4 mg every 6 hours as needed. 7.  Morphine 20 mg/mL oral concentrate 0.25 mg every 1 to 2 hours as needed.  DISCHARGE FOLLOWUP: No follow-up is recommended.   HISTORY: The patient is a 81 year old Caucasian female with past medical history significant for history of colon cancer in the past who underwent hemiarthroplasty of the right hip on 12/03/2012 and presented back on 12/16/2012 with bleeding from incision site with drainage from right hip incision. She was admitted by Dr. Martha ClanKrasinski who followed the patient along. The patient had to  undergo right hip hematoma irrigation and debridement on 12/22/2012. Post procedure the patient's cultures were sent and revealed MRSA.  Light growth of methicillin-resistant Staphylococcus aureus was noted in the wound resistant to all antibiotics except gentamicin, vancomycin, linezolid, Tygacil as well as trimethoprim sulfamethoxazole. Post procedure, when would cultures came back, the patient was started on vancomycin IV.  She deteriorated and became nauseated and started vomiting. No output from her colostomy bag was noted. Gastroenterologist plus surgical consultation was obtained. The patient underwent a CT scan of her abdomen and pelvis on 12/25/2012 which revealed findings consistent with small bowel obstruction without perforation. The patient was followed by a surgeon as well as gastroenterologist however did not improve significantly and family, after the long patient's struggle, decided on palliative care measures, comfort care measures. The patient is being discharged to hospice home with the above-mentioned medications and followup for final care. On the day of discharge, the patient's vital signs: Temperature is 98.6, pulse 71, respiratory rate 18, blood pressure 169/75 and saturation was 90 to 91% on room air at rest.   TIME SPENT: 40 minutes. ____________________________ Brandi Caperima Foster Frericks, MD rv:sb D: 12/31/2012 14:26:18 ET T: 12/31/2012 16:12:28 ET JOB#: [id number]  cc: Brandi Caperima Brandi Crass, MD, <Dictator> Brandi Iacovelli MD ELECTRONICALLY SIGNED 01/08/2013 12:13

## 2014-09-11 NOTE — Consult Note (Signed)
Pt CC is ileus vs partial SBO.  Her abd is less distended today and less tender.  Still no ostomy out put.  NOted 300cc in suction from NG tube.  Consider putting some CT contrast (clear or red) which often seems to open up partial SBO conditions when done as part of a CT. No other suggestions at this time.  Electronic Signatures: Scot JunElliott, Robert T (MD)  (Signed on 10-Aug-14 10:11)  Authored  Last Updated: 10-Aug-14 10:11 by Scot JunElliott, Robert T (MD)

## 2014-09-11 NOTE — Consult Note (Signed)
   Comments   Lengthy family meeting with pt's daughter, son-in-law, and grandson. Reviewed patient's clinical course as well as options for workup per consultants including possible CT and ? sgy. Family does not seem interested in surgical intervention. They seem to recognize that her clinical course has experienced multiple complications and that she may never recover to a quality of living that she would be satisfied with. They say that they have discussed the case with Dr Winona LegatoVaickute and likely will opt for comfort care. Family are struggling with making the right decisions. We talked about various options including the [facility], home with hospice [facility] vs continued supportive medical care and rehab. Family want to discuss this tonight and they ask that we speak with GI and surgery and hospitalist and family wants to speak with Dr Ernest PineHooten who is a family friend.  followup with family tomorrow.  60 minutes  Electronic Signatures: Auguste Tebbetts, Daryl EasternJoshua R (NP)  (Signed 11-Aug-14 17:11)  Authored: Palliative Care   Last Updated: 11-Aug-14 17:11 by Malachy MoanBorders, Nova Schmuhl R (NP)

## 2014-09-11 NOTE — Consult Note (Signed)
Brief Consult Note: Diagnosis: ileus vs sbo.   Patient was seen by consultant.   Consult note dictated.   Recommend further assessment or treatment.   Comments: I have personally seen Brandi Porter and agree with Brandi Porter's note.   Brandi Porter likley has an ileus due to pain medication and immobility.  The KUB is still pending.  Depending on the KUB, she may require CT scan if their is concern for SBO or NG tube. Ottherwise, the management for the ileus will be to mobilize as much as able, wean narcotics as able, ensure Phos, Mg,K+,Ca normalized.  May consider small does of miralax depending on how severe the ileus appears on KUB.   Thank you for the consult.  Will cont to follow.  Electronic Signatures: Dow Adolphein, Matthew (MD)  (Signed 05-Aug-14 18:18)  Authored: Brief Consult Note   Last Updated: 05-Aug-14 18:18 by Dow Adolphein, Matthew (MD)

## 2014-09-11 NOTE — Consult Note (Signed)
   Comments   Spoke with pt's daughter via phone. She said that family talked last night and they have made the decision to transfer patient to the [facility]osp[facility] for comfort care. Will have Ginny Ward, hospice liaison meet with family to clarify financial details as family have many questions about specifics.  cell # [phone]  Electronic Signatures: Malachy MoanBorders, Breona Cherubin R (NP)  (Signed 12-Aug-14 09:57)  Authored: Palliative Care Phifer, Harriett SineNancy (MD)  (Signed 12-Aug-14 14:22)  Authored: Palliative Care   Last Updated: 12-Aug-14 14:22 by Phifer, Harriett SineNancy (MD)

## 2014-09-11 NOTE — Consult Note (Signed)
PATIENT NAME:  Brandi Porter, Brandi Porter MR#:  [id number] DATE OF BIRTH:  03/10/1913  DATE OF CONSULTATION:  12/24/2012  CONSULTING PHYSICIAN:  Hardie ShackletonKaryn M. Rynn Markiewicz, PA-C, for Dr. Trixie DeisMatthew Ryan  REFERRING PHYSICIAN:  Dr. Martha ClanKrasinski   REASON FOR CONSULT:  No output with her colostomy bag, history of an ileus.   HISTORY OF PRESENT ILLNESS: This is a pleasant, 81 year old female, with a past medical history significant for dementia, who recently underwent a right hip hemiarthroplasty on 12/03/2012 due to a femoral neck fracture. This was performed by Dr. Martha ClanKrasinski. She was admitted with bloody drainage from the incision site. Follow up cultures did confirm MRSA, and she is currently on contact precautions as well as IV antibiotics. Of note, she does have a history of colorectal cancer, status post surgery, with colostomy bag. She has not had any output in her colostomy bag for the past 48 hours. She has been given milk of magnesia as well as lactulose over the past 24 hours or so, and there has been no response. Her abdomen is soft, and the patient is not expressing any abdominal pain. There has been no nausea or vomiting. She does have a history of dementia, however, and is somewhat difficult to get a history from. Much of this interaction and history is obtained from the patient's daughter, who is at bedside.   PAST MEDICAL HISTORY:  Dementia, coronary artery disease, hypothyroidism, colorectal cancer.   SURGICAL HISTORY: Colostomy placement, with partial colectomy, cholecystectomy, hemorrhoidectomy, and right hemiarthroplasty earlier in July.   ALLERGIES:  No known drug allergies.   SOCIAL HISTORY:  The patient does currently have dementia, and her daughter is her primary caregiver. No current alcohol, tobacco or illicit drug use.   FAMILY HISTORY:  Negative for GI malignancy, colon polyps or IBD.   ALLERGIES:  No known drug allergies.   REVIEW OF SYSTEMS:  A 10-system review of systems was obtained on the  patient as best as possible. The patient does have dementia, but much of the review of systems was obtained from the patient's daughter. However, it was quite limited.   PHYSICAL EXAMINATION: VITAL SIGNS:  Blood pressure 165/77, heart rate 83, respirations 18, temp 98 degrees, bedside pulse ox 93%.  GENERAL: This is a pleasant, 81 year old female, resting quietly and comfortably in bed, in no acute distress. She is alert, but unable to tell if oriented.  PULMONARY:  Respirations are even and unlabored.  CARDIAC:   Regular rate and rhythm. S1 and S2 noted.  ABDOMEN:  Soft. There are bowel sounds noted in 3 of the 4 quadrants. However, I am unable to hear bowel sounds in her right lower quadrant. She is not distended. She is not tender.  EXTREMITIES:  Negative for lower extremity edema, dressings on her hip from recent surgery and subsequent debridement are noted.   LABORATORY DATA:  White blood cells 6.5, hemoglobin 8.9, hematocrit 26.3, platelets 289. Sodium 137, potassium 3.9, BUN 17, creatinine 0.82, glucose 89. MCV is 91.   ASSESSMENT: 1.  Colostomy malfunction. The patient has not had any output through her colostomy bag for the past 48 hours or so.  2.  Methicillin-resistant staphylococcus aureus infection from recent right hip hemiarthroplasty. She is status post debridement, and currently on IV antibiotics.  3.  Dementia.   PLAN:  I have discussed this patient's case in detail with Dr. Trixie DeisMatthew Ryan, who is involved in the development of the patient's plan of care. At this time, we are awaiting a KUB of  the abdomen to evaluate for presence of ileus. We do highly suspect that this is the case, as she does have a history of this, and she has been immobile as well as receiving narcotics following her surgery. We do recommend monitoring her electrolytes to ensure that these are all stable, and limiting narcotic intake as best as possible, as this could certainly be contributing. We are comfortable  with continuing lactulose at this time. We do also recommend that she be n.p.o. status until we can evaluate the KUB findings. Continue plans as outlined by Orthopedics and Internal Medicine. We will continue to monitor this patient closely and make further recommendations pending the findings of the KUB and per clinical course.   Thank you so much for this consultation and for allowing [place] to participate in the patient's plan of care.    ____________________________ Hardie Shackleton. Octaviano Mukai, PA-C kme:mr D: 12/24/2012 16:58:38 ET T: 12/24/2012 19:13:00 ET JOB#: [id number]  cc: Hardie Shackleton. Darlene Bartelt, PA-C, <Dictator> Hardie Shackleton Daeshawn Redmann PA ELECTRONICALLY SIGNED 01/03/2013 13:35

## 2014-09-11 NOTE — H&P (Signed)
Subjective/Chief Complaint Bloody drainage from right hip incision.   History of Present Illness Patient is a 81 year old female sent to the [facility] ER for evaluation for bleeding from her surgical incision.  She underwent a right hip hemiarthroplasty on 12-03-12  for a femoral neck hip fracture of indeterminate age.  The patient has dementia and is unable to provide a history.  The patient's daughter and her husband are at the bedside in the ER.   Past Med/Surgical Hx:  Anxiety:   MI - Myocardial Infarct:   CAD:   Hypothyroidism:   Colon or Rectal Cancer:   GERD - Esophageal Reflux:   Alzheimer's Disease:   Denies medical history:   Colostomy:   ALLERGIES:  No Known Allergies:   HOME MEDICATIONS: Medication Instructions Status  magnesium hydroxide 8% oral suspension 30 milliliter(s) orally once a day (at bedtime), As Needed - for Constipation Active  ferrous sulfate 325 mg (65 mg elemental iron) oral tablet 1 tab(s) orally 2 times a day (with meals) (11 am, 4 pm) Active  calcium-vitamin D 500 mg-200 intl units oral tablet 1 tab(s) orally 2 times a day (with meals) (11 am, 4 pm) Active  Metoprolol Tartrate 25 mg oral tablet 1 tab(s) orally once a day (8 am) Active  Myrbetriq 25 mg oral tablet, extended release 1 tab(s) orally once a day (8 am) Active  Tylenol 325 mg oral tablet 2 tab(s) orally every 4 hours, As Needed for temperature >100.5 Active  enoxaparin 40 mg/0.4 mL injectable solution 0.4 milliliter(s) subcutaneous once a day (9 am) Active  acetaminophen-HYDROcodone 325 mg-5 mg oral tablet 1 tab(s) orally every 6 hours (2 am, 8 am, 2 pm, 8 pm) Active  levothyroxine 100 mcg (0.1 mg) oral tablet 1.5 tab(s) orally once a day (in the morning) (8 am) Active  acetaminophen-HYDROcodone 325 mg-5 mg oral tablet 1 tab(s) orally every 4 to 6 hours, As Needed - for Pain Active  RABEprazole 20 mg oral delayed release tablet 1 tab(s) orally once a day (9 am) Active  LORazepam 1 mg oral tablet 1  tab(s) orally 3 times a day as needed for anxiety (control) Active  aspirin 81 mg oral tablet, chewable 1 tab(s) orally once a day (9 am) Active   Family and Social History:  Family History Non-Contributory   Place of Living Nursing Home   Review of Systems:  Subjective/Chief Complaint Right hip bloody drainage   ROS Pt not able to provide ROS   Medications/Allergies Reviewed Medications/Allergies reviewed   Physical Exam:  GEN no acute distress   HEENT PERRL, hearing intact to voice, dry oral mucosa, Oropharynx clear, patient hard of hearing   NECK supple  trachea midline   RESP normal resp effort   CARD LE edema present  no JVD   ABD soft  + colostomy   EXTR Right hip with dark erythema around incision.  Scant amount of dark bloody drainage from incision consistent with venous blood/hematoma.  Patient has bilateral posterior heel blisters and an area of eschar on the posterior right leg without evidence of infection.Leg and thigh compartments are soft and compressible.   SKIN Blisters/early ulcers bilateral posterior calcaneal area.  Pedal pulses are palpable.  Edema in lower extremities.   NEURO Patient unable to follow commands for exam today.   PSYCH poor insight, awake   Lab Results: Hepatic:  28-Jul-14 15:11   Bilirubin, Total 0.7  Alkaline Phosphatase 126  SGPT (ALT) 38  SGOT (AST) 34  Total Protein, Serum 6.7  Albumin, Serum  2.3  Routine Chem:  28-Jul-14 15:11   Glucose, Serum  133  BUN  29  Creatinine (comp) 1.24  Sodium, Serum  133  Potassium, Serum 3.8  Chloride, Serum 100  CO2, Serum 26  Calcium (Total), Serum 8.7  Osmolality (calc) 274  eGFR (African American)  42  eGFR (Non-African American)  36 (eGFR values <46m/min/1.73 m2 may be an indication of chronic kidney disease (CKD). Calculated eGFR is useful in patients with stable renal function. The eGFR calculation will not be reliable in acutely ill patients when serum creatinine is  changing rapidly. It is not useful in  patients on dialysis. The eGFR calculation may not be applicable to patients at the low and high extremes of body sizes, pregnant women, and vegetarians.)  Anion Gap 7  Routine Coag:  28-Jul-14 15:07   Prothrombin  15.3  INR 1.2 (INR reference interval applies to patients on anticoagulant therapy. A single INR therapeutic range for coumarins is not optimal for all indications; however, the suggested range for most indications is 2.0 - 3.0. Exceptions to the INR Reference Range may include: Prosthetic heart valves, acute myocardial infarction, prevention of myocardial infarction, and combinations of aspirin and anticoagulant. The need for a higher or lower target INR must be assessed individually. Reference: The Pharmacology and Management of the Vitamin K  antagonists: the seventh ACCP Conference on Antithrombotic and Thrombolytic Therapy. CFUXNA.3557Sept:126 (3suppl): [id number] A HCT value >55% may artifactually increase the PT.  In one study,  the increase was an average of 25%. Reference:  "Effect on Routine and Special Coagulation Testing Values of Citrate Anticoagulant Adjustment in Patients with High HCT Values." American Journal of Clinical Pathology 2006;126:400-405.)  Activated PTT (APTT) 32.9 (A HCT value >55% may artifactually increase the APTT. In one study, the increase was an average of 19%. Reference: "Effect on Routine and Special Coagulation Testing Values of Citrate Anticoagulant Adjustment in Patients with High HCT Values." American Journal of Clinical Pathology 2006;126:400-405.)  Routine Hem:  28-Jul-14 15:11   WBC (CBC)  15.4  RBC (CBC)  3.26  Hemoglobin (CBC)  9.2  Hematocrit (CBC)  30.1  Platelet Count (CBC) 272 (Result(s) reported on 16 Dec 2012 at 03:25PM.)  MCV 92  MCH 28.1  MCHC  30.4  RDW  16.2    Assessment/Admission Diagnosis Right hip hematoma and bilateral heel ulcers/blisters   Plan Patient is being  admitted to the orthopaedic service for monitoring of the wound drainage.  She will be started on empiric antibiotics while the drainage persists.  Clinical picture seems consistent with a post-op hematoma.  Patient also has developed bilateral heel blisters with likely underlying ulcers.   She will be brought in for care of these.  I will consult podiatry for recommendations.  Patient will be on heel precautions and will be ordered for heel boots.  Patient will receive PT/OT while and inpatient.   Electronic Signatures: KThornton Park(MD)  (Signed 28-Jul-14 17:57)  Authored: CHIEF COMPLAINT and HISTORY, PAST MEDICAL/SURGIAL HISTORY, ALLERGIES, HOME MEDICATIONS, FAMILY AND SOCIAL HISTORY, REVIEW OF SYSTEMS, PHYSICAL EXAM, LABS, ASSESSMENT AND PLAN   Last Updated: 28-Jul-14 17:57 by KThornton Park(MD)

## 2014-09-11 NOTE — Op Note (Signed)
PATIENT NAME:  Brandi Porter, Brandi Porter MR#:  [id number] DATE OF BIRTH:  09-30-1912  DATE OF PROCEDURE:  12/22/2012  PREOPERATIVE DIAGNOSIS: Right hip hematoma with persistent drainage from the surgical incision.   POSTOPERATIVE DIAGNOSIS:  Right hip hematoma with persistent drainage from the surgical incision.   PROCEDURE: Irrigation and debridement of right hip wound with evacuation of hematoma.   ANESTHESIA: General.   SURGEON: Thornton Park, MD   SPECIMENS: Superficial and deep hip culture swabs and hematoma and soft tissue superficial right hip to microbiology.   COMPLICATIONS: None.   ESTIMATED BLOOD LOSS: 25 mL.   INDICATIONS FOR PROCEDURE: Brandi Porter is a 81 year old female who underwent a right hip hemiarthroplasty on 12/03/2012. She was readmitted on 12/16/2012 from her nursing facility for persistent bloody drainage from her incision. The patient was found to have a subcutaneous hematoma by CAT scan. The patient has remained afebrile. She presented with a white count of 15.4, but it normalized on hospital day 2 and has remained within normal limits ever since. The patient has not shown signs of sepsis. The patient has been on Kefzol and clindamycin during her hospital stay. When the drainage did not diminished satisfactorily, she was taken off Lovenox. However, despite being off Lovenox for 2 to 3 days and the patient has not had complete resolution of her incisional drainage. The decision was made therefore to take her back to the operating room to wash out the hip incision, evacuated the hematoma and check for infection. I discussed the risks and benefits of doing this with the patient's daughter, who has power of attorney, given that the patient has dementia. The daughter had agreed to proceed with surgery today.   PROCEDURE NOTE: The patient was brought to the operating room. The anesthesiologist decided to use general anesthesia for this case. The patient had her right hip marked with  the word "yes" prior to arrival in the operating room. A timeout was performed to verify the patient's name, date of birth, medical record number, correct site of surgery, and correct procedure to be performed. It was also used to verify the patient had received antibiotics and that all appropriate instruments, implants and radiographic studies were available in the room. Once all in attendance were in agreement, the case began. The patient had her skin staples removed. She was then prepped and draped in a sterile fashion. A superficial culture was taken as the incision was opened slightly. The incision was then extended along its full original length. The appearance was that of hematoma. I used a curette and a rongeur to remove any hemorrhagic or necrotic-appearing tissue. The fascia lata appeared to be intact, except for a 5 mm opening in the inferior half of the fascia lata incision. There was no active drainage from this hole. I decided not to open the fascia lata, but did take a deep culture with a swab through this opening. After all hemorrhagic and necrotic tissue was debrided from the skin edges and the superficial portion of her wound, the patient was pulse lavaged with 6 liters GU impregnated saline. Prior to closure of the fascia lata, I also irrigated with bulb irrigation 1000 mL of GU impregnated saline below fashion to irrigate a deep inside the hip. Once the wounds were completely irrigated, the fascia lata was closed with 0 PDS. A JP drain was placed with an exit stab incision, approximately 2 to 3 cm below the inferior pole of the original surgical incision. A 0 PDS was used to  close the deep layer to eliminate any dead space. 2-0 PDS was then used to close the subcutaneous tissues. Skin staples were placed to approximate the skin. The patient had an occlusive dressing placed over the incision with dry sterile dressings above. The patient was then awakened and brought to the PAC-U in stable  condition. I was scrubbed and present for the entire case and all sharp and instrument counts were correct at the conclusion of the case. I met with up with the patient's daughter in the patient's hospital room immediately postoperative. We had a long discussion about the operation and the patient's recovery. I explained to her that I did not see signs of infection, but we did send cultures to the lab which will follow-up. We will continue the patient on Kefzol and clindamycin while we await cultures. If the patient is found to have positive cultures or continues to drain despite this wash out, she may require removal of the prosthesis and treatment with IV antibiotics in a 2-stage procedure. However, a low-grade infection at 81 years old could also be potentially treated with chronic antibiotics if infection is found on culture or suspected clinically.    ____________________________ Timoteo Gaul, MD klk:cc D: 12/22/2012 12:29:39 ET T: 12/22/2012 21:25:46 ET JOB#: [id number]  cc: Timoteo Gaul, MD, <Dictator> Timoteo Gaul MD ELECTRONICALLY SIGNED 01/09/2013 16:58

## 2014-09-11 NOTE — Op Note (Signed)
PATIENT NAME:  Brandi Porter, Brandi Porter MR#:  [id number] DATE OF BIRTH:  [birth year]  DATE OF PROCEDURE:  12/03/2012  PREOPERATIVE DIAGNOSIS:  Right femoral neck hip fracture.   POSTOPERATIVE DIAGNOSIS:  Right femoral neck hip fracture.  PROCEDURE:  Right hip hemiarthroplasty.   SURGEON:  Kathreen Devoid, M.D.   ANESTHESIA:  Spinal.   SPECIMEN:  Femoral head to Pathology.   ESTIMATED BLOOD LOSS:  150 mL.   COMPLICATIONS:  None.   IMPLANTS:  Stryker Accolade HFX stem, size 2 and size 44 Stryker Unitrax head with a +0 degree neck adjustment sleeve.   INDICATIONS FOR THE PROCEDURE:  The patient is a 81 year old female who has dementia. The patient was noted by the daughter to not be ambulating over the past week. There was a question of a fall possibly last week, but this was unwitnessed. Given the patient's complaints of pain in the right lower extremity, she was transferred to [facility]. Radiographs diagnosed a right femoral neck hip fracture.   I had recommended a right hip hemiarthroplasty for this patient for pain control. The daughter was in agreement with the plan for surgery. I reviewed the risks and benefits of surgery with the patient's daughter. She understands the risks of surgery include infection requiring the removal of the prosthesis, bleeding requiring blood transfusion, nerve or blood vessel injury, especially injury to the sciatic nerve leading to foot drop or dorsal foot numbness, leg length discrepancy, change in lower extremity rotation, fracture, dislocation, persistent right hip pain, failure to return to ambulation and the need for further surgery including conversion to a total hip arthroplasty. Medical complications include, but are not limited to, DVT and pulmonary embolism, myocardial infarction, stroke, pneumonia, respiratory failure and death. The patient's daughter understood these risks and wished to proceed with surgery.   PROCEDURE:  The patient  was marked with the word "yes" over the right hip according to the hospital's right site protocol. She was brought to the Operating Room where she underwent a spinal anesthetic by the anesthesia service. She was then positioned in a right lateral decubitus position taking care to pad all bony prominences. An axillary roll was placed under the patient's right side and adequate padding was placed around the common peroneal nerve of the right lower extremity to avoid compression during the case. The patient was held into position with a pegboard. She was prepped and draped in a sterile fashion.   A time-out was performed to verify the patient's name, date of birth, medical record number, correct site of surgery, and correct procedure to be performed. It was also used to verify the patient had received antibiotics and that all appropriate instruments, implants and radiographic studies were available in the room. Once all in attendance were in agreement, the procedure began.   A curvilinear incision just posterior to the greater trochanter was created. The incision was drawn out with a surgical marker prior to the incision being made based upon bony landmarks. The subcutaneous tissue was then dissected with the electrocautery taking care to cauterize all bleeding vessels during exposure. The fascia lata was then identified and cleared of overlying subcutaneous tissue. A deep #10 blade was then used to make a curved incision within fascia lata. A Charnley retractor was then placed taking care to avoid any compression or traction on the sciatic nerve. The hip bursa was then identified and resected revealing the underlying external rotators. These were carefully removed from their attachment on the posterior greater trochanter.  They were tagged for later repair. The external rotators were then reflected posteriorly to protect the sciatic nerve. The underlying hip capsule was then identified. A T-shaped capsulotomy was  performed, and both leaflets of the capsule were tagged with a #2 Tycron for later repair. This revealed the underlying femoral neck hip fracture. The femoral head was removed with a corkscrew device and sent to Pathology for further analysis.   The femoral head was sized to be 44 mm in diameter. The attention was then turned to femoral neck preparation. The osteotomy guide was placed alongside the proximal femur. Electrocautery was used to mark the level on the neck for the osteotomy. An oscillating saw was used to create the proximal femoral osteotomy one fingerbreadth above the lesser trochanter. Cobra retractors were then placed around the acetabulum. The trial size 44 femoral head was inserted into socket and found to have excellent suction fit.   The attention was turned back to the femoral canal preparation. The femoral hip skid was placed under the femoral neck and a Cobra retractor was placed medially. A box osteotome created the initial entry point into the proximal femur. A single hand reamer was then sent into the femoral canal. A femoral canal sounder was then used to ensure no penetration of the femoral cortex was created during hand reaming.   Trial femoral broaches were then gently malleted into position. The lateral aspect of the femoral canal was rasped with broaches to ensure that the prosthesis was placed into a varus position. The best medial and lateral fit was obtained with a size 2 femoral broach. The associated neck and a +0 head with an outer shell of 44 mm was then assembled and the prosthesis was reduced. The leg was taken through a full range of motion and deemed to be stable. There was no undue tension in full extension. The patient could actually internally and externally rotate in both flexion and extension. The leg lengths were equivalent. The trial prosthesis was then removed. The hip joint was copiously irrigated. The actual Stryker Accolade HFX size 2 stem was then malleted  into position. Again, the trial head with a +0 neck trial was placed and reduced. Again the hip range of motion and leg lengths were excellent. The trial head was then removed. The trunnion of the Accolade TMZF stem was copiously irrigated and dried. The +0 neck adjustment sleeve was then inserted into the 44 mm Unitrax head. This was then [facility] onto the femoral stem trunnion and carefully reduced into the hip joint. The hip joint was taken through a full range of motion and deemed to be stable. The leg lengths were equivalent. The hip joint was copiously irrigated. The posterior capsule was then repaired using #2 Tycron and then repaired to the abductor tendon. The external rotators were also repaired through drill holes through the posterior trochanter, and the conjoined tendon was repaired to the abductor tendon as well. The hip joint was copiously irrigated. The fascia lata was closed with interrupted 0 Vicryl, the skin closed with 2-0 Vicryl and then the skin approximated with staples. A dry sterile dressing was applied.   I was scrubbed and present for the entire case, and all sharp and instrument counts were correct at the conclusion of the case. The patient was then transferred to a hospital bed and brought to the PACU in stable condition. She had done well with anesthesia during the case.   I spoke with the patient's family in the patient's  room postoperatively to let her daughter know the case had gone without complication and the patient was stable in the Recovery Room.   ____________________________ Kathreen DevoidKevin L. Suzan Manon, MD klk:jm D: 12/05/2012 16:55:22 ET T: 12/05/2012 17:31:33 ET JOB#: [id number]  cc: Kathreen DevoidKevin L. Algenis Ballin, MD, <Dictator> Kathreen DevoidKEVIN L Brier Firebaugh MD ELECTRONICALLY SIGNED 12/05/2012 18:42

## 2014-09-11 NOTE — Consult Note (Signed)
PATIENT NAME:  Brandi Porter, Brandi Porter MR#:  [id number] DATE OF BIRTH:  1912-05-25  DATE OF CONSULTATION:  12/02/2012  REFERRING PHYSICIAN:   CONSULTING PHYSICIAN:  Herschell Dimesichard J. Renae GlossWieting, MD  PRIMARY CARE PHYSICIAN:  Dr. [place]ester [place] in [place].   REFERRING PHYSICIAN: Dr. Martha ClanKrasinski, orthopedics.   REASON FOR CONSULTATION: Preoperative management for hip fracture.   HISTORY OF PRESENT ILLNESS: This is a 81 year old female who does not remember what happened. The daughter thinks she may have broken her hip about a week ago. She was walking with a walker but not steady previously and about a week ago she stopped walking. She has been complaining of right leg pain, mostly right knee pain and the patient developed some swelling of the right lower extremity. In the ER, she was found to have a right hip fracture and hospitalist services were contacted for preoperative management. The patient is a very poor historian secondary to dementia and the patient is very hard of hearing. The daughter, at the bedside is able to give history.   PAST MEDICAL HISTORY: Dementia with hallucinations, colorectal cancer with recurrence, coronary artery disease, gastroesophageal reflux disease, hypothyroidism.   PAST SURGICAL HISTORY: Colostomy, cholecystectomy, hemorrhoid surgery.   MEDICATIONS: As per prescription writer include: Aspirin 81 mg daily, Celexa 10 mg half tablet daily for a week and then the patient is supposed to stop, levothyroxine 75 mcg daily, lorazepam 1 mg 3 times a day as needed for anxiety, metoprolol 25 mg daily, Myrbetriq 25 mg extended-release daily, rabeprazole 20 mg daily.   SOCIAL HISTORY: Lives at the [facility]. No alcohol, smoking or drug use. She used to be a homemaker.   FAMILY HISTORY: Father died of old age. Mother died of vaginal cancer.   REVIEW OF SYSTEMS: Difficult to obtain secondary to dementia and the patient is hard of hearing.   PHYSICAL EXAMINATION: VITAL SIGNS: Initial  blood pressure 150/67, temperature 98.6, pulse 75, respirations 18, pulse oximetry 96% on room air.  GENERAL: No respiratory distress, lying flat in bed.  EYES: Conjunctivae pale. Lids normal. Pupils equal, round, and reactive to light. Extraocular muscles difficult to test secondary to hard of hearing and dementia.  EARS, NOSE, MOUTH, AND THROAT: Tympanic membranes: No erythema. Nasal mucosa: No erythema. Throat:  No erythema. No exudate seen. Lips and gums: No lesions.  NECK: No JVD. No bruits. No lymphadenopathy. No thyromegaly. No thyroid nodules palpated.  RESPIRATORY:  Lungs clear to auscultation. No use of accessory muscles to breathe. No rhonchi, rales, or wheeze heard.  CARDIOVASCULAR: S1, S2 normal. +4/6 systolic ejection murmur. Carotid upstroke 2+ bilaterally. No bruits. Dorsalis pedis pulses 2+ bilaterally. Trace edema of the left lower extremity, 1+ edema right lower extremity.  ABDOMEN: Soft, nontender. No organomegaly/splenomegaly. Normoactive bowel sounds.  LYMPHATIC: No lymph nodes in the neck.  MUSCULOSKELETAL: Trace edema left lower extremity, 1+ edema right lower extremity. No clubbing. No cyanosis.  Right leg shortened and externally rotated.  NEUROLOGIC: The patient moving her arms without issue. Difficult to test cranial nerves secondary to hearing loss. Did not test deep tendon reflexes secondary to hip fracture.  PSYCHIATRIC: Difficult to test secondary to hearing loss.   LABORATORY AND RADIOLOGICAL DATA: Pelvis x-ray shows fracture of the hip, right knee lucency. Visualized portions of the proximal third of the shaft of the right femur, which are likely normal. Cannot exclude partially image fracture here. Right hip shows acute subcapital fracture of the right hip.   Chest x-ray: Density left lower hemithorax, nonspecific.  May reflect atelectasis or pneumonia.   EKG shows sinus rhythm, 65 beats per minute, premature atrial complexes, left axis deviation, right bundle  branch block, left ventricular hypertrophy, Q waves in V1 V2.   Laboratory data is still pending at this point.   ASSESSMENT AND PLAN: 1.  Preoperative consultation. The patient is higher risk for surgery secondary to the patient's age and heart disease, but this is a low risk procedure. The daughter is contemplating whether or not to undergo surgery or not. I told her that the mortality is high within the first year of hip fracture, especially if the patient does not get up and walk again. That fracture pain is probably the worse than the surgical pain. With her dementia, the rehab process would be difficult with her history of dementia. Daughter is still contemplating whether or not to undergo the procedure or not. If she does not undergo the procedure, she will probably be bedbound for at least 4 months, high risk of skin breakdown, deep vein thrombosis, pneumonia and death.   The patient is already on a preoperative beta blocker. No further cardiac testing needed to be done preoperatively.   2.  History of coronary artery disease and hypertension. Last blood pressure reading is a little bit elevated likely secondary to pain. Continue metoprolol and continue to monitor.  3.  Dementia with history of hallucinations. I would recommend cutting back to Tylenol as quickly as possible for pain control.  4.  Gastroesophageal reflux disease. We do not have a rabeprazole here, we will have to switch to Protonix.  5.  Hypothyroidism. Continue levothyroxine.  TIME SPENT ON CONSULTATION: 55 minutes.   CODE STATUS: The patient is a DO NOT RESUSCITATE.   May proceed with surgery if family elects to do so.   ____________________________ Herschell Dimes. Renae Gloss, MD rjw:cc D: 12/02/2012 20:48:18 ET T: 12/02/2012 21:10:23 ET JOB#: [id number]  cc: Herschell Dimes. Renae Gloss, MD, <Dictator> Dr. Lester Relampago Kathreen Devoid, MD  Salley Scarlet MD ELECTRONICALLY SIGNED 12/03/2012 19:19

## 2014-09-11 NOTE — Discharge Summary (Signed)
PATIENT NAME:  Brandi Porter, Brandi Porter MR#:  [id number] DATE OF BIRTH:  1913/03/18  DATE OF ADMISSION:  12/02/2012  DATE OF DISCHARGE:  12/06/2012  ADMITTING DIAGNOSIS:  Right femoral neck hip fracture.   HISTORY OF PRESENT ILLNESS:  Miss Brandi Porter is a 81 year old female with dementia, who was sent to the [facility] Emergency Department after complaining of right lower extremity pain. There was a question of an unwitnessed fall the week prior. According to the patient's daughter, she stopped ambulating approximately one weeks ago. At baseline, the patient ambulates with a walker. The x-rays at the [facility] Emergency Department had confirmed a femoral neck hip fracture. She was admitted to the Orthopedic Surgery service for definitive management.   PAST MEDICAL HISTORY:  Includes anxiety, myocardial infarction, coronary artery disease, hypothyroidism, colon and rectal cancer, and is status post colostomy, gastroesophageal reflux, Alzheimer's dementia.   ALLERGIES:  No known drug allergies.   HOME MEDICATIONS INCLUDE:  Celexa 10 mg 1/2 tablet p.o. x 1 week, metoprolol 25 mg 1 tablet daily, Myrbetriq 25 mg p.o. daily, rabeprazole 20 mg p.o. daily, levothyroxine 75 mcg p.o. daily, lorazepam 1 mg t.i.d. p.r.n. for anxiety, and aspirin 81 mg p.o. daily.   SOCIAL HISTORY:  The patient lives at [facility].   HOSPITAL COURSE: The patient was admitted on 12/09/2012, she was admitted to the orthopedic surgery service. The hospitalist service was consulted for preoperative clearance. She was initially evaluated by Dr. Renae GlossWieting. The patient was cleared for surgery. On 12/03/2012, the patient underwent an uncomplicated right hip hemiarthroplasty. She returned to the orthopedic floor postop. She received 24 hours of postop antibiotics. The patient was ordered for  incentive spirometry and was placed on DVT prophylaxis, which included bilateral TED  stockings and AV1 compression devices, along with Lovenox 30 mg subcu b.i.d., starting the day after surgery. The patient was written for physical therapy and occupational therapy consults as well, starting on postoperative day #1. They continued to follow her throughout her hospitalization. On postoperative day #2, the patient had her dressing changed. Her incision was clean, dry and intact. Staples were in place. There was no erythema, ecchymosis or swelling. The patient had soft and compressible thigh and leg compartments. She had palpable pedal pulses, and could flex and extend her toes and ankle. She had no calf tenderness, and had a negative Homans sign. The patient's Foley catheter was removed on postop day #2 as well. She continued to make progress with physical therapy, and was able to get out of bed to a chair. She was found to have acute postop anemia on postop day #2, with a hemoglobin of 6.9 and hematocrit of 19.9. She was transfused 1 unit of packed red blood cells. Hemoglobin following her transfusion was 8.6. The patient had daily labs drawn while she was hospitalized to ensure a stable hematocrit. The patient remained hemodynamically stable throughout her hospitalization. She was alert, and did not suffer postop dementia. Because she was making adequate progress with physical therapy and clinically was stable from both orthopedic and medical standpoint, she was prepared for discharge on postop day #3.  DISCHARGE INSTRUCTIONS: The patient will be discharged with instructions to continue physical and occupational therapy for hip range of motion, gait training and lower extremity strengthening. She must observe posterior precautions at all times for a minimum of 3 to 6 months postop. The patient should wear her abduction pillow between her legs when she is in bed.  The patient does have dementia, and understanding  the posterior hip precautions will be a challenge. She will continue the use of her TED  stockings, and will continue on Lovenox 40 mg subcu daily for DVT prophylaxis for a total of 4 weeks following discharge. The patient should have a CBC checked 48 hours post discharge to ensure a stable hematocrit.   The patient will resume all of her home medications.   She should have dry sterile dressing changes postoperatively until her incision is clean, dry and intact. Her staples will be removed in the orthopedic office upon her first postoperative visit. She will follow up in the office 10 to 14 days following discharge.   DISCHARGE DIAGNOSES:  1.  Right femoral neck hip fracture, status post right hip hemiarthroplasty.  2.  Acute postoperative blood loss anemia.   ____________________________ Kathreen Devoid, MD klk:mr D: 12/05/2012 19:12:00 ET T: 12/05/2012 20:35:53 ET JOB#: [id number]  cc: Kathreen Devoid, MD, <Dictator> Kathreen Devoid MD ELECTRONICALLY SIGNED 12/11/2012 18:53

## 2015-07-17 IMAGING — CR DG ABDOMEN 1V
1 series · 1 of 1 positions shown · non-contrast
Comparison: none

REASON FOR EXAM: SBO, abdominal distension, eval for improvement.
COMMENTS:   Bedside (portable):Y

PROCEDURE:     DXR - DXR ABDOMEN AP ONLY  - December 30, 2012  [DATE]
RESULT:     Comparisons:  12/24/2012

[ap]
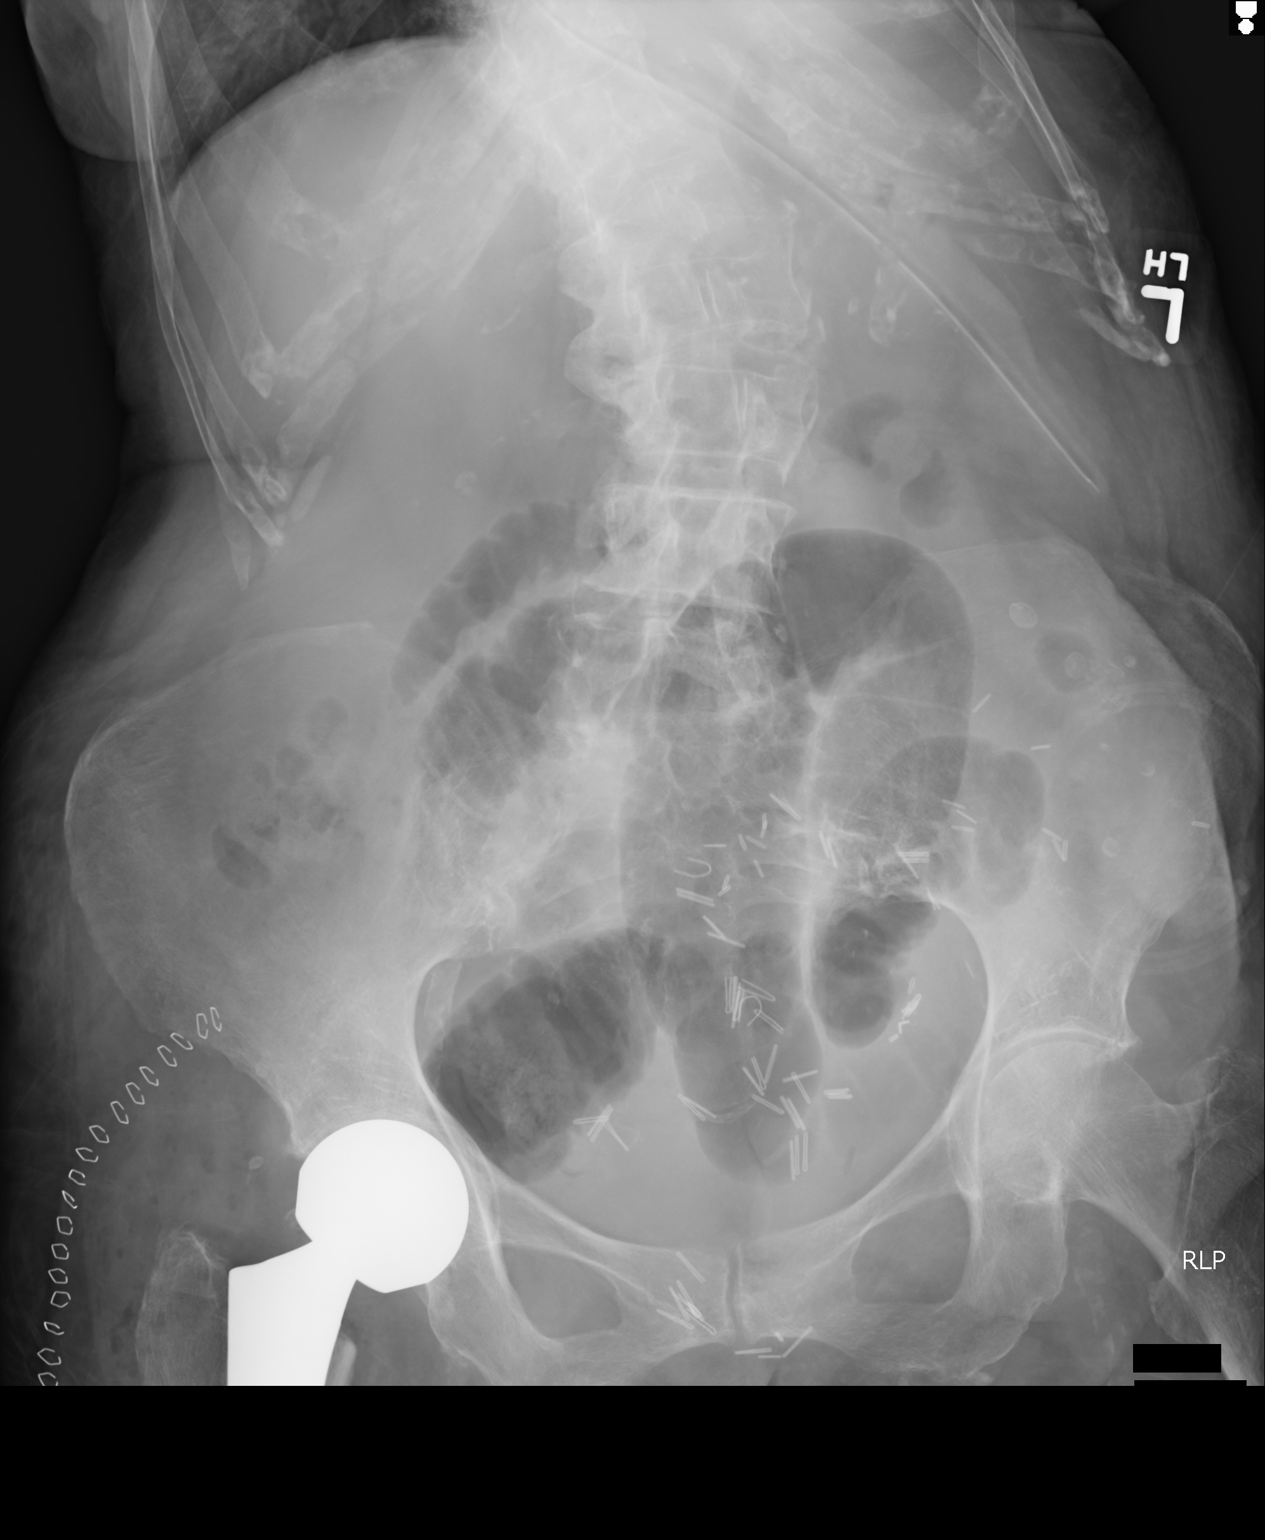

[1 of 1 positions shown; findings below may reference images not displayed]

FINDINGS: Supine radiograph of the abdomen is provided.

There multiple dilated loop of small bowel measuring up to 5.5 cm. There is
a relative paucity of colonic gas. There is a nasogastric tube present in
satisfactory position. There is evidence of a recent right hip arthroplasty.
There is no pathologic calcification along the expected course of the
ureters. There is no evidence of pneumoperitoneum, portal venous gas, or
pneumatosis.

The osseous structures are unremarkable.
IMPRESSION: Persistent small bowel dilatation which appears similar to the prior exam of
12/24/2012 concerning for small bowel obstruction.

[REDACTED]
